# Patient Record
Sex: Female | Born: 2000 | Race: White | Hispanic: No | Marital: Single
Health system: Midwestern US, Community
[De-identification: ages and names within clinical notes are randomized; demographics above are authoritative.]

## PROBLEM LIST (undated history)

## (undated) ENCOUNTER — Inpatient Hospital Stay (HOSPITAL_COMMUNITY): Payer: Self-pay

## (undated) DIAGNOSIS — F909 Attention-deficit hyperactivity disorder, unspecified type: Secondary | ICD-10-CM

## (undated) DIAGNOSIS — O133 Gestational [pregnancy-induced] hypertension without significant proteinuria, third trimester: Secondary | ICD-10-CM

## (undated) DIAGNOSIS — D271 Benign neoplasm of left ovary: Secondary | ICD-10-CM

## (undated) DIAGNOSIS — G8918 Other acute postprocedural pain: Secondary | ICD-10-CM

## (undated) HISTORY — DX: Attention-deficit hyperactivity disorder, unspecified type: F90.9

## (undated) HISTORY — PX: TONSILLECTOMY: SUR1361

---
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
## 2020-08-05 ENCOUNTER — Inpatient Hospital Stay
Admit: 2020-08-05 | Discharge: 2020-08-05 | Disposition: A | Discharge status: Home / Self Care | Payer: MEDICAID | Attending: Emergency Medicine

## 2020-08-05 DIAGNOSIS — O26899 Other specified pregnancy related conditions, unspecified trimester: Secondary | ICD-10-CM

## 2020-08-05 LAB — COMPREHENSIVE METABOLIC PANEL
ALT: 14 U/L (ref 12–78)
AST: 30 U/L (ref 15–37)
Albumin/Globulin Ratio: 0.9 — ABNORMAL LOW (ref 1.1–2.2)
Albumin: 4 g/dL (ref 3.5–5.0)
Alkaline Phosphatase: 82 U/L (ref 45–117)
Anion Gap: 13 mmol/L (ref 5–15)
BUN: 3 MG/DL — ABNORMAL LOW (ref 6–20)
Bun/Cre Ratio: 5 — ABNORMAL LOW (ref 12–20)
CO2: 24 mmol/L (ref 21–32)
Calcium: 8.8 MG/DL (ref 8.5–10.1)
Chloride: 104 mmol/L (ref 97–108)
Creatinine: 0.55 MG/DL (ref 0.55–1.02)
EGFR IF NonAfrican American: >60 mL/min/{1.73_m2} (ref >60)
GFR African American: >60 mL/min/{1.73_m2} (ref >60)
Globulin: 4.3 g/dL — ABNORMAL HIGH (ref 2.0–4.0)
Glucose: 108 mg/dL — ABNORMAL HIGH (ref 65–100)
Potassium: 3.9 mmol/L (ref 3.5–5.1)
Sodium: 141 mmol/L (ref 136–145)
Total Bilirubin: 0.6 MG/DL (ref 0.2–1.0)
Total Protein: 8.3 g/dL — ABNORMAL HIGH (ref 6.4–8.2)

## 2020-08-05 LAB — CBC WITH AUTO DIFFERENTIAL
Basophils %: 0 % (ref 0–1)
Basophils Absolute: 0 10*3/uL (ref 0.0–0.1)
Eosinophils %: 0 % (ref 0–7)
Eosinophils Absolute: 0 10*3/uL (ref 0.0–0.4)
Granulocyte Absolute Count: 0 10*3/uL (ref 0.00–0.04)
Hematocrit: 42.9 % (ref 35.0–47.0)
Hemoglobin: 13.9 g/dL (ref 11.5–16.0)
Immature Granulocytes: 0 % (ref 0.0–0.5)
Lymphocytes %: 20 % (ref 12–49)
Lymphocytes Absolute: 1.9 10*3/uL (ref 0.8–3.5)
MCH: 28.8 PG (ref 26.0–34.0)
MCHC: 32.4 g/dL (ref 30.0–36.5)
MCV: 88.8 FL (ref 80.0–99.0)
MPV: 10.3 FL (ref 8.9–12.9)
Monocytes %: 5 % (ref 5–13)
Monocytes Absolute: 0.5 10*3/uL (ref 0.0–1.0)
NRBC Absolute: 0 10*3/uL (ref 0.00–0.01)
Neutrophils %: 75 % (ref 32–75)
Neutrophils Absolute: 6.9 10*3/uL (ref 1.8–8.0)
Nucleated RBCs: 0 PER 100 WBC
Platelets: 405 10*3/uL — ABNORMAL HIGH (ref 150–400)
RBC: 4.83 M/uL (ref 3.80–5.20)
RDW: 12.8 % (ref 11.5–14.5)
WBC: 9.4 10*3/uL (ref 3.6–11.0)

## 2020-08-05 LAB — HCG URINE, QL. - POC
HCG, Pregnancy, Urine, POC: NEGATIVE
Pregnancy test,urine (POC): NEGATIVE

## 2020-08-05 LAB — URINALYSIS WITH MICROSCOPIC
BACTERIA, URINE: NEGATIVE /hpf
Bilirubin, Urine: NEGATIVE
Blood, Urine: NEGATIVE
Glucose, Ur: NEGATIVE mg/dL
Ketones, Urine: NEGATIVE mg/dL
Nitrite, Urine: NEGATIVE
Protein, UA: NEGATIVE mg/dL
Specific Gravity, UA: <1.005 NA (ref 1.003–1.030)
Urobilinogen, UA, POCT: 0.2 EU/dL (ref 0.2–1.0)
pH, UA: 7 (ref 5.0–8.0)

## 2020-08-05 LAB — URINALYSIS W/MICROSCOPIC
Bacteria: NEGATIVE /hpf
Bilirubin: NEGATIVE
Blood: NEGATIVE
Glucose: NEGATIVE mg/dL
Ketone: NEGATIVE mg/dL
Nitrites: NEGATIVE
Protein: NEGATIVE mg/dL
Specific gravity: <1.005 (ref 1.003–1.030)
Urobilinogen: 0.2 EU/dL (ref 0.2–1.0)
pH (UA): 7 (ref 5.0–8.0)

## 2020-08-05 LAB — CBC WITH AUTOMATED DIFF
ABS. BASOPHILS: 0 10*3/uL (ref 0.0–0.1)
ABS. EOSINOPHILS: 0 10*3/uL (ref 0.0–0.4)
ABS. IMM. GRANS.: 0 10*3/uL (ref 0.00–0.04)
ABS. LYMPHOCYTES: 1.9 10*3/uL (ref 0.8–3.5)
ABS. MONOCYTES: 0.5 10*3/uL (ref 0.0–1.0)
ABS. NEUTROPHILS: 6.9 10*3/uL (ref 1.8–8.0)
ABSOLUTE NRBC: 0 10*3/uL (ref 0.00–0.01)
BASOPHILS: 0 % (ref 0–1)
EOSINOPHILS: 0 % (ref 0–7)
HCT: 42.9 % (ref 35.0–47.0)
HGB: 13.9 g/dL (ref 11.5–16.0)
IMMATURE GRANULOCYTES: 0 % (ref 0.0–0.5)
LYMPHOCYTES: 20 % (ref 12–49)
MCH: 28.8 PG (ref 26.0–34.0)
MCHC: 32.4 g/dL (ref 30.0–36.5)
MCV: 88.8 FL (ref 80.0–99.0)
MONOCYTES: 5 % (ref 5–13)
MPV: 10.3 FL (ref 8.9–12.9)
NEUTROPHILS: 75 % (ref 32–75)
NRBC: 0 PER 100 WBC
PLATELET: 405 10*3/uL — ABNORMAL HIGH (ref 150–400)
RBC: 4.83 M/uL (ref 3.80–5.20)
RDW: 12.8 % (ref 11.5–14.5)
WBC: 9.4 10*3/uL (ref 3.6–11.0)

## 2020-08-05 LAB — METABOLIC PANEL, COMPREHENSIVE
A-G Ratio: 0.9 — ABNORMAL LOW (ref 1.1–2.2)
ALT (SGPT): 14 U/L (ref 12–78)
AST (SGOT): 30 U/L (ref 15–37)
Albumin: 4 g/dL (ref 3.5–5.0)
Alk. phosphatase: 82 U/L (ref 45–117)
Anion gap: 13 mmol/L (ref 5–15)
BUN/Creatinine ratio: 5 — ABNORMAL LOW (ref 12–20)
BUN: 3 MG/DL — ABNORMAL LOW (ref 6–20)
Bilirubin, total: 0.6 MG/DL (ref 0.2–1.0)
CO2: 24 mmol/L (ref 21–32)
Calcium: 8.8 MG/DL (ref 8.5–10.1)
Chloride: 104 mmol/L (ref 97–108)
Creatinine: 0.55 MG/DL (ref 0.55–1.02)
GFR est AA: >60 mL/min/{1.73_m2} (ref >60)
GFR est non-AA: >60 mL/min/{1.73_m2} (ref >60)
Globulin: 4.3 g/dL — ABNORMAL HIGH (ref 2.0–4.0)
Glucose: 108 mg/dL — ABNORMAL HIGH (ref 65–100)
Potassium: 3.9 mmol/L (ref 3.5–5.1)
Protein, total: 8.3 g/dL — ABNORMAL HIGH (ref 6.4–8.2)
Sodium: 141 mmol/L (ref 136–145)

## 2020-08-05 LAB — URINE CULTURE HOLD SAMPLE

## 2020-08-05 MED ORDER — KETOROLAC TROMETHAMINE 30 MG/ML INJECTION
30 mg/mL (1 mL) | INTRAMUSCULAR | Status: AC
Start: 2020-08-05 — End: 2020-08-05
  Administered 2020-08-05: 09:00:00 via INTRAVENOUS

## 2020-08-05 MED ORDER — ONDANSETRON 4 MG TAB, RAPID DISSOLVE
4 mg | Freq: Three times a day (TID) | ORAL | Status: DC | PRN
Start: 2020-08-05 — End: 2020-08-05
  Administered 2020-08-05: 09:00:00 via ORAL

## 2020-08-05 MED FILL — KETOROLAC TROMETHAMINE 30 MG/ML INJECTION: 30 mg/mL (1 mL) | INTRAMUSCULAR | Qty: 1

## 2020-08-05 MED FILL — ONDANSETRON 4 MG TAB, RAPID DISSOLVE: 4 mg | ORAL | Qty: 2

## 2020-08-05 NOTE — ED Notes (Signed)
Triage note:intermittent cramp type mid lower abdominal pain x 2 hours denies pregnancy.

## 2020-08-05 NOTE — ED Notes (Signed)
 The patient left the Emergency Department ambulatory, alert and oriented and in no acute distress. The patient was encouraged to call or return to the ED for worsening issues or problems and was encouraged to schedule a follow up appointment for continuing care.      The patient verbalized understanding of discharge instructions and prescriptions, all questions were answered. The patient has no further concerns at this time.

## 2020-08-05 NOTE — ED Provider Notes (Signed)
HPI     20 year old female with a history of preeclampsia with her first pregnancy.  She to PTA presents the emergency department with 2 hours of abdominal cramping.  Patient states she had an IUD placed in September October.  This is the first time she has had any cramping pain since.  She has not taken anything for her  symptoms prior to coming.  She denies any fever.  She has some nausea but no vomiting.  She is not having any vaginal bleeding.  She states there is always a possibility she is pregnant.  She denies any change in bowel or urine.  She has been vaccinated for Covid and denies any Covid symptoms or exposure.  There is no back pain    No past medical history on file.    No past surgical history on file.      No family history on file.    Social History     Socioeconomic History   ??? Marital status: Not on file     Spouse name: Not on file   ??? Number of children: Not on file   ??? Years of education: Not on file   ??? Highest education level: Not on file   Occupational History   ??? Not on file   Tobacco Use   ??? Smoking status: Not on file   ??? Smokeless tobacco: Not on file   Substance and Sexual Activity   ??? Alcohol use: Not on file   ??? Drug use: Not on file   ??? Sexual activity: Not on file   Other Topics Concern   ??? Not on file   Social History Narrative   ??? Not on file     Social Determinants of Health     Financial Resource Strain:    ??? Difficulty of Paying Living Expenses: Not on file   Food Insecurity:    ??? Worried About Running Out of Food in the Last Year: Not on file   ??? Ran Out of Food in the Last Year: Not on file   Transportation Needs:    ??? Lack of Transportation (Medical): Not on file   ??? Lack of Transportation (Non-Medical): Not on file   Physical Activity:    ??? Days of Exercise per Week: Not on file   ??? Minutes of Exercise per Session: Not on file   Stress:    ??? Feeling of Stress : Not on file   Social Connections:    ??? Frequency of Communication with Friends and Family: Not on file   ???  Frequency of Social Gatherings with Friends and Family: Not on file   ??? Attends Religious Services: Not on file   ??? Active Member of Clubs or Organizations: Not on file   ??? Attends Banker Meetings: Not on file   ??? Marital Status: Not on file   Intimate Partner Violence:    ??? Fear of Current or Ex-Partner: Not on file   ??? Emotionally Abused: Not on file   ??? Physically Abused: Not on file   ??? Sexually Abused: Not on file   Housing Stability:    ??? Unable to Pay for Housing in the Last Year: Not on file   ??? Number of Places Lived in the Last Year: Not on file   ??? Unstable Housing in the Last Year: Not on file         ALLERGIES: Patient has no known allergies.    Review of  Systems   Constitutional: Negative for fever.   HENT: Negative for congestion.    Eyes: Negative for visual disturbance.   Respiratory: Negative for cough and shortness of breath.    Cardiovascular: Negative for chest pain.   Gastrointestinal: Positive for abdominal pain and nausea. Negative for constipation, diarrhea and vomiting.   Endocrine: Negative for polyuria.   Genitourinary: Positive for menstrual problem and pelvic pain. Negative for dysuria, flank pain, vaginal bleeding and vaginal discharge.   Musculoskeletal: Negative for gait problem.   Neurological: Negative for headaches.   Psychiatric/Behavioral: Negative for dysphoric mood.       There were no vitals filed for this visit.         Physical Exam  Constitutional:       General: She is not in acute distress.     Appearance: She is well-developed.   HENT:      Head: Normocephalic and atraumatic.      Mouth/Throat:      Pharynx: No oropharyngeal exudate.   Eyes:      General: No scleral icterus.        Right eye: No discharge.         Left eye: No discharge.      Pupils: Pupils are equal, round, and reactive to light.   Neck:      Vascular: No JVD.   Cardiovascular:      Rate and Rhythm: Normal rate and regular rhythm.      Heart sounds: Normal heart sounds. No murmur  heard.      Pulmonary:      Effort: Pulmonary effort is normal. No respiratory distress.      Breath sounds: Normal breath sounds. No stridor. No wheezing or rales.   Chest:      Chest wall: No tenderness.   Abdominal:      General: Bowel sounds are normal. There is no distension.      Palpations: Abdomen is soft. There is no mass.      Tenderness: There is abdominal tenderness in the suprapubic area. There is no guarding or rebound.   Musculoskeletal:         General: Normal range of motion.      Cervical back: Normal range of motion and neck supple.   Skin:     General: Skin is warm and dry.      Capillary Refill: Capillary refill takes less than 2 seconds.      Findings: No rash.   Neurological:      Mental Status: She is oriented to person, place, and time.   Psychiatric:         Behavior: Behavior normal.         Thought Content: Thought content normal.         Judgment: Judgment normal.          MDM       Procedures    CBC normal. preg neg. Cramps resolved with toradol. Patient wants to go home and see her Ob/GYN regarding getting IUD out.  Patient can follow up as planned. Return if worsening

## 2021-10-25 ENCOUNTER — Inpatient Hospital Stay
Admit: 2021-10-25 | Discharge: 2021-10-25 | Disposition: A | Discharge status: Home / Self Care | Payer: MEDICAID | Attending: Emergency Medicine

## 2021-10-25 ENCOUNTER — Emergency Department: Admit: 2021-10-25 | Payer: MEDICAID

## 2021-10-25 DIAGNOSIS — N938 Other specified abnormal uterine and vaginal bleeding: Secondary | ICD-10-CM

## 2021-10-25 DIAGNOSIS — N83299 Other ovarian cyst, unspecified side: Secondary | ICD-10-CM

## 2021-10-25 LAB — CBC WITH AUTO DIFFERENTIAL
Absolute Immature Granulocyte: 0 10*3/uL (ref 0.00–0.04)
Basophils %: 0 % (ref 0–1)
Basophils Absolute: 0 10*3/uL (ref 0.0–0.1)
Eosinophils %: 0 % (ref 0–7)
Eosinophils Absolute: 0 10*3/uL (ref 0.0–0.4)
Hematocrit: 34.5 % — ABNORMAL LOW (ref 35.0–47.0)
Hemoglobin: 11.5 g/dL (ref 11.5–16.0)
Immature Granulocytes: 1 % — ABNORMAL HIGH (ref 0–0.5)
Lymphocytes %: 17 % (ref 12–49)
Lymphocytes Absolute: 1.4 10*3/uL (ref 0.8–3.5)
MCH: 30.3 PG (ref 26.0–34.0)
MCHC: 33.3 g/dL (ref 30.0–36.5)
MCV: 90.8 FL (ref 80.0–99.0)
MPV: 9.2 FL (ref 8.9–12.9)
Monocytes %: 8 % (ref 5–13)
Monocytes Absolute: 0.6 10*3/uL (ref 0.0–1.0)
Neutrophils %: 74 % (ref 32–75)
Neutrophils Absolute: 6.1 10*3/uL (ref 1.8–8.0)
Nucleated RBCs: 0 PER 100 WBC
Platelets: 370 10*3/uL (ref 150–400)
RBC: 3.8 M/uL (ref 3.80–5.20)
RDW: 12.4 % (ref 11.5–14.5)
WBC: 8.2 10*3/uL (ref 3.6–11.0)
nRBC: 0 10*3/uL (ref 0.00–0.01)

## 2021-10-25 LAB — COMPREHENSIVE METABOLIC PANEL
ALT: 13 U/L (ref 12–78)
AST: 11 U/L — ABNORMAL LOW (ref 15–37)
Albumin/Globulin Ratio: 0.9 — ABNORMAL LOW (ref 1.1–2.2)
Albumin: 3.7 g/dL (ref 3.5–5.0)
Alk Phosphatase: 70 U/L (ref 45–117)
Anion Gap: 6 mmol/L (ref 5–15)
BUN: 5 mg/dL — ABNORMAL LOW (ref 6–20)
Bun/Cre Ratio: 8 — ABNORMAL LOW (ref 12–20)
CO2: 29 mmol/L (ref 21–32)
Calcium: 9.2 mg/dL (ref 8.5–10.1)
Chloride: 101 mmol/L (ref 97–108)
Creatinine: 0.59 mg/dL (ref 0.55–1.02)
Est, Glom Filt Rate: >60 mL/min/{1.73_m2} (ref >60)
Globulin: 3.9 g/dL (ref 2.0–4.0)
Glucose: 93 mg/dL (ref 65–100)
Potassium: 3.7 mmol/L (ref 3.5–5.1)
Sodium: 136 mmol/L (ref 136–145)
Total Bilirubin: 0.5 mg/dL (ref 0.2–1.0)
Total Protein: 7.6 g/dL (ref 6.4–8.2)

## 2021-10-25 LAB — URINALYSIS
Bilirubin Urine: NEGATIVE
Glucose, UA: NEGATIVE mg/dL
Ketones, Urine: NEGATIVE mg/dL
Leukocyte Esterase, Urine: NEGATIVE
Nitrite, Urine: NEGATIVE
Protein, UA: NEGATIVE mg/dL
Specific Gravity, UA: 1.02 (ref 1.003–1.030)
Urobilinogen, Urine: 0.2 EU/dL (ref 0.2–1.0)
pH, Urine: 6 (ref 5.0–8.0)

## 2021-10-25 LAB — PREGNANCY, URINE: HCG(Urine) Pregnancy Test: NEGATIVE

## 2021-10-25 LAB — URINALYSIS, MICRO: BACTERIA, URINE: NEGATIVE /hpf

## 2021-10-25 MED ORDER — MEDROXYPROGESTERONE ACETATE 2.5 MG PO TABS
2.5 MG | ORAL_TABLET | Freq: Three times a day (TID) | ORAL | 0 refills | Status: DC
Start: 2021-10-25 — End: 2021-12-05

## 2021-10-25 MED ORDER — ACETAMINOPHEN 500 MG PO TABS
500 MG | ORAL_TABLET | Freq: Four times a day (QID) | ORAL | 0 refills | Status: AC | PRN
Start: 2021-10-25 — End: 2022-01-01

## 2021-10-25 MED ORDER — IBUPROFEN 400 MG PO TABS
400 MG | ORAL_TABLET | Freq: Four times a day (QID) | ORAL | 0 refills | Status: DC | PRN
Start: 2021-10-25 — End: 2022-01-01

## 2021-10-25 NOTE — ED Triage Notes (Signed)
Pt reports heavy vaginal bleeding with clots x 4 days. Pt states she went to [facility] ER and tested negative for pregnancy and her quant was negative. Reports she had a pelvic exam and they told her that her cervix was closed. Pt is not on any contraceptive.

## 2021-10-25 NOTE — ED Provider Notes (Signed)
[facility] EMERGENCY DEPT  EMERGENCY DEPARTMENT ENCOUNTER       Pt Name: Emily Ware  MRN: [id number]  Birthdate 21-May-2001  Date of evaluation: 10/25/2021  Provider: Aggie Cosier, MD   PCP: None None  Note Started: 7:42 PM 10/25/21     CHIEF COMPLAINT       Chief Complaint   Patient presents with    Vaginal Bleeding        HISTORY OF PRESENT ILLNESS: 1 or more elements      History From: Patient  None     Emily Ware is a 21 y.o. female who presents with large clots, heavy bleeding one reportedly "as long as my arm". States bleeding through tampons every hour. Bleeding for 4-5 days is is growing more concerned.     Seen at [facility] 2 days ago (unable to see that chart on Care everywhere). States was tested for stds and taken vaginal exam and told symptoms were normal.     States was having lower abdominal cramping pains     Previously had IUD that was removed one year ago and also was on Depo >1 years. No birth control for the past year.     No OB/Gyn.      Nursing Notes were all reviewed and agreed with or any disagreements were addressed in the HPI.     REVIEW OF SYSTEMS      Review of Systems   Constitutional:  Positive for activity change.   HENT: Negative.     Respiratory: Negative.     Cardiovascular: Negative.    Gastrointestinal:  Positive for abdominal pain. Negative for nausea and vomiting.   Genitourinary:  Positive for vaginal bleeding. Negative for dyspareunia and dysuria.   Musculoskeletal: Negative.    Skin: Negative.       Positives and Pertinent negatives as per HPI.    PAST HISTORY     Past Medical History:  History reviewed. No pertinent past medical history.      Past Surgical History:  History reviewed. No pertinent surgical history.    Family History:  History reviewed. No pertinent family history.    Social History:  Social History     Tobacco Use    Smoking status: Never    Smokeless tobacco: Never   Substance Use Topics    Drug use: Never       Allergies:  No Known Allergies    CURRENT  MEDICATIONS      Discharge Medication List as of 10/25/2021 10:13 AM            PHYSICAL EXAM      ED Triage Vitals [10/25/21 0749]   Enc Vitals Group      BP (!) 155/89      Pulse 93      Respirations 19      Temp 97.8 F (36.6 C)      Temp src       SpO2 99 %      Weight - Scale 107 lb (48.5 kg)      Height 5' (1.524 m)      Head Circumference       Peak Flow       Pain Score       Pain Loc       Pain Edu?       Excl. in GC?               Physical Exam  Vitals and nursing note reviewed.  Constitutional:       Appearance: Normal appearance.      Comments: Anxious     HENT:      Head: Normocephalic.      Right Ear: External ear normal.      Left Ear: External ear normal.      Nose: Nose normal.      Mouth/Throat:      Mouth: Mucous membranes are moist.   Cardiovascular:      Rate and Rhythm: Normal rate.      Pulses: Normal pulses.      Heart sounds: Normal heart sounds.   Pulmonary:      Effort: Pulmonary effort is normal.   Abdominal:      General: Abdomen is flat.      Palpations: Abdomen is soft.      Tenderness: There is abdominal tenderness (discomfort to palpation rlq).   Musculoskeletal:      Cervical back: Normal range of motion.   Skin:     General: Skin is warm.      Capillary Refill: Capillary refill takes less than 2 seconds.   Neurological:      General: No focal deficit present.      Mental Status: She is alert.   Psychiatric:         Mood and Affect: Mood normal.           DIAGNOSTIC RESULTS   LABS:             Contains abnormal data CBC with Auto Differential (Final result)   Component (Lab Inquiry)  Collection Time Result Time WBC RBC HGB HCT MCV   10/25/21 08:17:00 10/25/21 08:29:31 8.2 3.80 11.5 34.5 Low  90.8       Collection Time Result Time MCH MCHC RDW PLT MPV   10/25/21 08:17:00 10/25/21 08:29:31 30.3 33.3 12.4 370 9.2       Collection Time Result Time NRBC NRBC Seg Neutrophils LYMPHO Monocytes   10/25/21 08:17:00 10/25/21 08:29:31 0.0 0.00 74 17 8       Collection Time Result Time EOS BASO  Immature Granulocytes SEGS ABS Absolute Lymph #   10/25/21 08:17:00 10/25/21 08:29:31 0 0 1 High  6.1 1.4       Collection Time Result Time Absolute Mono # Absolute Eos # BASOS ABS absimmgran DIFF TYPE   10/25/21 08:17:00 10/25/21 08:29:31 0.6 0.0 0.0 0.0 AUTOMATED         Final result                           Contains abnormal data Comprehensive Metabolic Panel (Final result)   Component (Lab Inquiry)  Collection Time Result Time NA K CL CO2 ANION GAP   10/25/21 08:17:00 10/25/21 08:46:58 136 3.7 101 29 6       Collection Time Result Time GLUCOSE BUN Creatinine Bun/Cre Ratio EGFR   10/25/21 08:17:00 10/25/21 08:46:58 93 5 Low  0.59 8 Low  >60       Pediatric calculato...       Collection Time Result Time CALCIUM BILI TOTAL AST ALT Alk Phosphatase   10/25/21 08:17:00 10/25/21 08:46:58 9.2 0.5 11 Low  13 70       Collection Time Result Time PROTEIN Albumin Globulin Albumin/Globulin Ratio   10/25/21 08:17:00 10/25/21 08:46:58 7.6 3.7 3.9 0.9 Low          Final result  Pregnancy, Urine (Final result)   Component (Lab Inquiry)  Collection Time Result Time HCG(Urine) Pregnancy Test   10/25/21 07:49:00 10/25/21 08:05:03 Negative         Final result                           Contains abnormal data Urinalysis (Final result)   Component (Lab Inquiry)  Collection Time Result Time COLOR U APPEARANCE SPEC GRAV pH, Urine Protein, UA   10/25/21 07:49:00 10/25/21 08:05:42 Yellow/Straw   Color Reference Range:... Clear 1.020 6.0 Negative       Collection Time Result Time GLUCOSE U KETONES U BILI UR BLOOD U Urobilinogen, Urine   10/25/21 07:49:00 10/25/21 08:05:42 Negative Negative Negative Large Abnormal  0.2       Collection Time Result Time NITRITE LEUKOCYTES, UR   10/25/21 07:49:00 10/25/21 08:05:42 Negative Negative         Final result                          Urinalysis, Micro (Final result)   Component (Lab Inquiry)  Collection Time Result Time WBC UA RBC  UA BACTERIA, URINE   10/25/21 07:49:00  10/25/21 08:19:37 0-4 0-5   0-5 Negative         Final result                      Imaging Results        US PELVIS COMPLETE (Final result)  Result time 10/25/21 09:42:06  Final result by Milda Smart Disler, MD (10/25/21 09:42:06)                Impression:    Complex cyst of right ovary measuring 5.6 x 5.3 x 3.8 cm.            Narrative:    INDICATION: pelvic pain, dysmenorrhea,     COMPARISON: None     EXAM: Real-time transabdominal ultrasound of the pelvis appear     FINDINGS: There is suboptimal assessment of the pelvis due to under distention   of the urinary bladder. The uterus measures 9.3 x 3.2 x 3.2 cm in size with   endometrial stripe thickness is estimated at 0.2 cm. No uterine mass is   suggested. No cervical enlargement is shown. Doppler flow is demonstrated in the   ovaries bilaterally with the right ovary measuring 2.3 x 2.0 x 2.5 cm in size.   The left ovary measures 3.1 x 4.3 x 5.4 cm with demonstration of complex cyst   measuring 5.6 x 5.3 x 3.8 cm. No free pelvic fluid is shown.                     RADIOLOGY:  Non-plain film images such as CT, Ultrasound and MRI are read by the radiologist. Plain radiographic images are visualized and preliminarily interpreted by the ED Provider with the below findings:     As aboe\ve     Interpretation per the Radiologist below, if available at the time of this note:     US PELVIS COMPLETE   Final Result   Complex cyst of right ovary measuring 5.6 x 5.3 x 3.8 cm.              PROCEDURES   Unless otherwise noted below, none  Procedures       CRITICAL CARE TIME   Not met  SCREENINGS   NIH Stroke Score       Heart Score       Curb-65          EMERGENCY DEPARTMENT COURSE and DIFFERENTIAL DIAGNOSIS/MDM   Vitals:    Vitals:    10/25/21 0749 10/25/21 0756 10/25/21 0914   BP: (!) 155/89 (!) 155/89 126/81   Pulse: 93 93 81   Resp: 19 19 14    Temp: 97.8 F (36.6 C) 97.8 F (36.6 C)    SpO2: 99% 99% 100%   Weight: 48.5 kg (107 lb)     Height: 1.524 m (5')               Patient was given the following medications:  Medications - No data to display    CONSULTS: (Who and What was discussed)  None      Chronic Conditions: none    Social Determinants affecting Dx or Tx: None    Records Reviewed (source and summary of external notes): Nursing Notes    CC/HPI Summary, DDx, ED Course, and Reassessment: clinically less likely to be torsion. Consider fibroids, cyst, endometriosis or normal varian menses in additiona ot recent pregnancy loss or ectopic.     ED Course as of 10/26/21 1942   Wed Oct 25, 2021   9562 Pt with heavy vaginal bleeding, recent visit to [facility] which I am unable to pull on on Care Everywhere. She is able to pull up recent labs which are unremarkable. She is upset due to perception that "nothing was done". We discussed at length all that was evaluated within  the limitations of EM evaluations. She declines repeat pelvic exam. Will recheck labs and obtain US but advise she see OB for evaluation of dysmenorrhea.  [MC]   [phone] Discussed US findings with patient. Consulting with GYN given size of cyst. Pending callback   [MC]   1002 Spoke with GYN on call Dr. Thedore Mins at [facility]. Recommends outpatient follow up for cyst. Pt to call office asap to make op appointment for options. Would like to start on OCP to control bleeding.  [MC]      ED Course User Index  [MC] Burnard Hawthorne, MD       Disposition Considerations (Tests not done, Shared Decision Making, Pt Expectation of Test or Tx.):       FINAL IMPRESSION     1. Complex ovarian cyst    2. DUB (dysfunctional uterine bleeding)    3. Vaginal bleeding    4. Abnormal vaginal bleeding          DISPOSITION/PLAN   DISPOSITION Decision To Discharge 10/25/2021 10:03:38 AM      Discharge Note: The patient is stable for discharge home. The signs, symptoms, diagnosis, and discharge instructions have been discussed, understanding conveyed, and agreed upon. The patient is to follow up as recommended or return to ER should their  symptoms worsen.      PATIENT REFERRED TO:  Holley Bouche, MD  [address]  [address]  [place] [address]  [phone]    Call in 1 day  for followup         DISCHARGE MEDICATIONS:     Medication List        START taking these medications      acetaminophen 500 MG tablet  Commonly known as: TYLENOL  Take 1 tablet by mouth 4 times daily as needed for Pain     ibuprofen 400 MG tablet  Commonly  known as: IBU  Take 1 tablet by mouth every 6 hours as needed for Pain     medroxyPROGESTERone 2.5 MG tablet  Commonly known as: Provera  Take 4 tablets by mouth every 8 (eight) hours for 7 days               Where to Get Your Medications        These medications were sent to [id number] [place] - [address] - P [phone] Carmon Ginsberg [phone]  [address] [place], [place] [address]      Phone: [phone]   acetaminophen 500 MG tablet  ibuprofen 400 MG tablet  medroxyPROGESTERone 2.5 MG tablet           DISCONTINUED MEDICATIONS:  Discharge Medication List as of 10/25/2021 10:13 AM          I am the Primary Clinician of Record.   Aggie Cosier, MD (electronically signed)      (Please note that parts of this dictation were completed with voice recognition software. Quite often unanticipated grammatical, syntax, homophones, and other interpretive errors are inadvertently transcribed by the computer software. Please disregards these errors. Please excuse any errors that have escaped final proofreading.)        Burnard Hawthorne, MD  10/26/21 1942

## 2021-10-25 NOTE — ED Notes (Signed)
Labs drawn      Darnelle Catalan, RN  10/25/21 [phone]

## 2021-10-25 NOTE — ED Notes (Signed)
UA sent     Darnelle Catalan, RN  10/25/21 [phone]

## 2021-11-06 ENCOUNTER — Encounter: Payer: MEDICAID | Attending: Specialist

## 2021-12-11 ENCOUNTER — Ambulatory Visit: Admit: 2021-12-11 | Discharge: 2021-12-11 | Payer: MEDICAID

## 2021-12-11 ENCOUNTER — Ambulatory Visit: Admit: 2021-12-11 | Discharge: 2021-12-11 | Payer: MEDICAID | Attending: Obstetrics & Gynecology

## 2021-12-11 DIAGNOSIS — N83209 Unspecified ovarian cyst, unspecified side: Secondary | ICD-10-CM

## 2021-12-11 DIAGNOSIS — N83202 Unspecified ovarian cyst, left side: Secondary | ICD-10-CM

## 2021-12-11 NOTE — Progress Notes (Signed)
HISTORY OF PRESENT ILLNESS  Emily Ware is a 21 y.o. female who presents today for the following:  Chief Complaint   Patient presents with    Ovarian Cyst     Pt. Was seen in May for a left ovarian cyst   Patient is a 21 year old G2 P2 female who presents today for follow-up from the ER for a large left ovarian cyst.  Patient reports continued pain in the left lower quadrant.  Ultrasound today reveals a persistent left ovarian cyst which is enlarged from previous exam.  [id number]    No Known Allergies    Current Outpatient Medications   Medication Sig Dispense Refill    acetaminophen (TYLENOL) 500 MG tablet Take 1 tablet by mouth 4 times daily as needed for Pain 120 tablet 0    ibuprofen (IBU) 400 MG tablet Take 1 tablet by mouth every 6 hours as needed for Pain 120 tablet 0     No current facility-administered medications for this visit.        Past Medical History:   Diagnosis Date    Anemia     Pre-eclampsia 2017    During pregnancy    Preterm delivery 2018    1 child my son currently 68 years old       Past Surgical History:   Procedure Laterality Date    CESAREAN SECTION  2018 & 2020       Family History   Problem Relation Age of Onset    Diabetes Mother     Diabetes Maternal Grandfather     Diabetes Maternal Grandmother        Social History     Socioeconomic History    Marital status: Single     Spouse name: Not on file    Number of children: Not on file    Years of education: Not on file    Highest education level: Not on file   Occupational History    Not on file   Tobacco Use    Smoking status: Never    Smokeless tobacco: Never   Substance and Sexual Activity    Alcohol use: Never    Drug use: Never    Sexual activity: Yes     Partners: Male     Birth control/protection: I.U.D.   Other Topics Concern    Not on file   Social History Narrative    Not on file     Social Determinants of Health     Financial Resource Strain: Not on file   Food Insecurity: Not on file   Transportation Needs: Not on file    Physical Activity: Not on file   Stress: Not on file   Social Connections: Not on file   Intimate Partner Violence: Not on file   Housing Stability: Not on file             PHYSICAL EXAM  BP 105/69 (Site: Left Upper Arm, Position: Sitting, Cuff Size: Small Adult)   Wt 106 lb (48.1 kg)   LMP 11/23/2021 (Approximate)   BMI 20.70 kg/m      Patient is a well-developed well-nourished female no apparent distress  She is alert and oriented x3  Head is normocephalic atraumatic pupils equal round react light accommodation  Neck is supple without adenopathy or thyromegaly  Heart is with regular rate and rhythm without murmurs rubs or gallops  Lungs are clear to auscultation and percussion bilaterally  Breasts are without masses bilaterally  Abdomen is soft nontender  nondistended bowel sounds are present and active  Extremities are without clubbing cyanosis or edema  Pulses are full and symmetric bilaterally  Pelvic  External genitalia within normal limits  Urethra is midline there are no apparent urethral lesions the bladder is within normal limits  Vagina is with normal rugae there is minimal discharge present in the vaginal vault  Cervix is parous, there are no apparent cervical lesions, there is no cervical motion tenderness  Uterus is normal size and contours  Adnexa with mass in left lower quadrant    ASSESSMENT and PLAN  Persistent left ovarian cyst  Plan: Scheduled for diagnostic laparoscopy and left ovarian cystectomy 01/01/2022.  @THISVISITONLYTEXTRESULTS @    Orders Placed This Encounter   Procedures    NON OB TRANSVAGINAL           Standing Status:   Future     Number of Occurrences:   1     Standing Expiration Date:   12/12/2022     Order Specific Question:   Reason for exam:     Answer:   Left Ovarian Cyst

## 2021-12-11 NOTE — Progress Notes (Signed)
Emily Ware is a 21 y.o. female who presents today for the following:  Chief Complaint   Patient presents with    Ovarian Cyst     Pt. Was seen in May for a left ovarian cyst          HPI  Is a 21 year old G2, P2 female who presents today for follow-up of a left ovarian cyst which was diagnosed through the ER.  Patient reports continued left-sided abdominal pain which has not improved over the 6+ weeks since her original ultrasound.    OB History       Gravida   2    Para   2    Term        Preterm   2    AB        Living   2         SAB        IAB        Ectopic        Molar        Multiple        Live Births   2                     @VS1 @   OBGyn Exam   Patient is well-developed well-nourished female no apparent distress  She is alert and oriented x3  Ultrasound today reveals 6-1/2 cm simple ovarian cyst which is actually increased in size from previous determination.  @THISVISITONLYTEXTRESULTS @       Assessment:  Persistent large left ovarian cyst  Plan: We will schedule patient for diagnostic laparoscopy and excision of left ovarian cyst    Orders Placed This Encounter   Procedures    NON OB TRANSVAGINAL           Standing Status:   Future     Number of Occurrences:   1     Standing Expiration Date:   12/12/2022     Order Specific Question:   Reason for exam:     Answer:   Left Ovarian Cyst

## 2021-12-11 NOTE — Progress Notes (Unsigned)
GYN -TRANSVAGINAL    Uterus:  Anteverted. 4.46 x 4.20 x 4.85cm    Fibroids: none visualized    Cervix: 3.11 cm    Endometrium:  .99cm     ROV:     3.71 x 2.26 x 2.77cm     LOV:  Enlarged. 6.20 x 5.98 x 6.48 cm. Irreg. Shaped anechoic cyst 5.25 x 4.87 x 5.08cm    No FF seen in CDS.

## 2021-12-26 ENCOUNTER — Inpatient Hospital Stay: Admit: 2021-12-26 | Payer: MEDICAID

## 2021-12-26 DIAGNOSIS — Z01812 Encounter for preprocedural laboratory examination: Secondary | ICD-10-CM

## 2021-12-26 LAB — CBC
Hematocrit: 41.1 % (ref 35.0–47.0)
Hemoglobin: 13.4 g/dL (ref 11.5–16.0)
MCH: 29.3 PG (ref 26.0–34.0)
MCHC: 32.6 g/dL (ref 30.0–36.5)
MCV: 89.9 FL (ref 80.0–99.0)
MPV: 10.9 FL (ref 8.9–12.9)
Nucleated RBCs: 0 PER 100 WBC
Platelets: 322 10*3/uL (ref 150–400)
RBC: 4.57 M/uL (ref 3.80–5.20)
RDW: 12.7 % (ref 11.5–14.5)
WBC: 4.9 10*3/uL (ref 3.6–11.0)
nRBC: 0 10*3/uL (ref 0.00–0.01)

## 2021-12-30 MED FILL — LACTATED RINGERS IV SOLN: INTRAVENOUS | Qty: 1000

## 2022-01-01 ENCOUNTER — Inpatient Hospital Stay: Payer: Medicaid (Managed Care) | Attending: Obstetrics & Gynecology

## 2022-01-01 LAB — POC PREGNANCY UR-QUAL: Preg Test, Ur: NEGATIVE

## 2022-01-01 MED ORDER — OXYCODONE HCL 5 MG PO TABS
5 MG | ORAL | Status: AC | PRN
Start: 2022-01-01 — End: 2022-01-01

## 2022-01-01 MED ORDER — OXYCODONE HCL 5 MG PO TABS
5 MG | ORAL | Status: AC | PRN
Start: 2022-01-01 — End: 2022-01-01
  Administered 2022-01-01: 16:00:00 10 mg via ORAL

## 2022-01-01 MED ORDER — HYDRALAZINE HCL 20 MG/ML IJ SOLN
20 MG/ML | INTRAMUSCULAR | Status: DC | PRN
Start: 2022-01-01 — End: 2022-01-01

## 2022-01-01 MED ORDER — FENTANYL CITRATE PF 50 MCG/ML IJ SOSY
50 MCG/ML | INTRAMUSCULAR | Status: DC | PRN
Start: 2022-01-01 — End: 2022-01-01
  Administered 2022-01-01: 13:00:00 50 via INTRAVENOUS

## 2022-01-01 MED ORDER — RHO D IMMUNE GLOBULIN 1500 UNIT/2ML IJ SOSY
1500 UNIT/2ML | Freq: Once | INTRAMUSCULAR | Status: DC | PRN
Start: 2022-01-01 — End: 2022-01-01

## 2022-01-01 MED ORDER — ONDANSETRON 4 MG PO TBDP
4 MG | Freq: Three times a day (TID) | ORAL | Status: DC | PRN
Start: 2022-01-01 — End: 2022-01-01

## 2022-01-01 MED ORDER — DIPHENHYDRAMINE HCL 50 MG/ML IJ SOLN
50 MG/ML | Freq: Once | INTRAMUSCULAR | Status: DC | PRN
Start: 2022-01-01 — End: 2022-01-01

## 2022-01-01 MED ORDER — METOCLOPRAMIDE HCL 5 MG/ML IJ SOLN
5 MG/ML | Freq: Once | INTRAMUSCULAR | Status: DC | PRN
Start: 2022-01-01 — End: 2022-01-01

## 2022-01-01 MED ORDER — OXYCODONE HCL 5 MG PO TABS
5 MG | ORAL | Status: DC | PRN
Start: 2022-01-01 — End: 2022-01-01

## 2022-01-01 MED ORDER — ROCURONIUM BROMIDE 50 MG/5ML IV SOLN
50 MG/5ML | INTRAVENOUS | Status: DC | PRN
Start: 2022-01-01 — End: 2022-01-01
  Administered 2022-01-01: 13:00:00 30 via INTRAVENOUS

## 2022-01-01 MED ORDER — FENTANYL CITRATE PF 50 MCG/ML IJ SOSY
50 MCG/ML | INTRAMUSCULAR | Status: DC | PRN
Start: 2022-01-01 — End: 2022-01-01

## 2022-01-01 MED ORDER — ESMOLOL HCL 100 MG/10ML IV SOLN
100 MG/10ML | INTRAVENOUS | Status: DC | PRN
Start: 2022-01-01 — End: 2022-01-01
  Administered 2022-01-01: 14:00:00 15 via INTRAVENOUS

## 2022-01-01 MED ORDER — LACTATED RINGERS IV SOLN
INTRAVENOUS | Status: DC
Start: 2022-01-01 — End: 2022-01-01

## 2022-01-01 MED ORDER — ONDANSETRON HCL 4 MG/2ML IJ SOLN
4 MG/2ML | Freq: Four times a day (QID) | INTRAMUSCULAR | Status: DC | PRN
Start: 2022-01-01 — End: 2022-01-01

## 2022-01-01 MED ORDER — NORMAL SALINE FLUSH 0.9 % IV SOLN
0.9 % | Freq: Two times a day (BID) | INTRAVENOUS | Status: DC
Start: 2022-01-01 — End: 2022-01-01

## 2022-01-01 MED ORDER — SODIUM CHLORIDE 0.9 % IV SOLN
0.9 % | INTRAVENOUS | Status: DC | PRN
Start: 2022-01-01 — End: 2022-01-01

## 2022-01-01 MED ORDER — LACTATED RINGERS IV SOLN
INTRAVENOUS | Status: DC
Start: 2022-01-01 — End: 2022-01-01
  Administered 2022-01-01: 12:00:00 via INTRAVENOUS

## 2022-01-01 MED ORDER — LABETALOL HCL 5 MG/ML IV SOLN
5 MG/ML | INTRAVENOUS | Status: DC | PRN
Start: 2022-01-01 — End: 2022-01-01

## 2022-01-01 MED ORDER — LACTATED RINGERS IV SOLN
Freq: Once | INTRAVENOUS | Status: DC
Start: 2022-01-01 — End: 2022-01-01

## 2022-01-01 MED ORDER — CEFAZOLIN SODIUM 1 G IJ SOLR
1 g | Freq: Once | INTRAMUSCULAR | Status: AC
Start: 2022-01-01 — End: 2022-01-01
  Administered 2022-01-01: 13:00:00 2000 mg via INTRAVENOUS

## 2022-01-01 MED ORDER — LIDOCAINE HCL 2 % IJ SOLN (MIXTURES ONLY)
2 % | INTRAMUSCULAR | Status: DC | PRN
Start: 2022-01-01 — End: 2022-01-01
  Administered 2022-01-01: 13:00:00 60 via INTRAVENOUS

## 2022-01-01 MED ORDER — LORAZEPAM 2 MG/ML IJ SOLN
2 MG/ML | Freq: Once | INTRAMUSCULAR | Status: DC | PRN
Start: 2022-01-01 — End: 2022-01-01

## 2022-01-01 MED ORDER — SUGAMMADEX SODIUM 200 MG/2ML IV SOLN
200 MG/2ML | INTRAVENOUS | Status: DC | PRN
Start: 2022-01-01 — End: 2022-01-01
  Administered 2022-01-01: 15:00:00 200 via INTRAVENOUS

## 2022-01-01 MED ORDER — DEXAMETHASONE 4 MG/ML IJ SOLN (MIXTURES ONLY)
4 MG/ML | INTRAMUSCULAR | Status: DC | PRN
Start: 2022-01-01 — End: 2022-01-01
  Administered 2022-01-01: 13:00:00 4 via INTRAVENOUS

## 2022-01-01 MED ORDER — IPRATROPIUM-ALBUTEROL 0.5-2.5 (3) MG/3ML IN SOLN
Freq: Once | RESPIRATORY_TRACT | Status: DC | PRN
Start: 2022-01-01 — End: 2022-01-01

## 2022-01-01 MED ORDER — BUPIVACAINE HCL (PF) 0.25 % IJ SOLN
0.25 % | INTRAMUSCULAR | Status: DC | PRN
Start: 2022-01-01 — End: 2022-01-01
  Administered 2022-01-01: 15:00:00 10 via INTRADERMAL

## 2022-01-01 MED ORDER — MEPERIDINE HCL 25 MG/ML IJ SOLN
25 MG/ML | INTRAMUSCULAR | Status: DC | PRN
Start: 2022-01-01 — End: 2022-01-01

## 2022-01-01 MED ORDER — MIDAZOLAM HCL 2 MG/2ML IJ SOLN
2 MG/ML | INTRAMUSCULAR | Status: DC | PRN
Start: 2022-01-01 — End: 2022-01-01
  Administered 2022-01-01: 13:00:00 2 via INTRAVENOUS

## 2022-01-01 MED ORDER — STERILE WATER FOR INJECTION (MIXTURES ONLY)
1 g | Freq: Once | INTRAMUSCULAR | Status: DC
Start: 2022-01-01 — End: 2022-01-01

## 2022-01-01 MED ORDER — NORMAL SALINE FLUSH 0.9 % IV SOLN
0.9 % | INTRAVENOUS | Status: DC | PRN
Start: 2022-01-01 — End: 2022-01-01

## 2022-01-01 MED ORDER — IBUPROFEN 400 MG PO TABS
400 MG | Freq: Three times a day (TID) | ORAL | Status: DC | PRN
Start: 2022-01-01 — End: 2022-01-01

## 2022-01-01 MED ORDER — IBUPROFEN 800 MG PO TABS
800 MG | ORAL_TABLET | Freq: Four times a day (QID) | ORAL | 0 refills | Status: AC | PRN
Start: 2022-01-01 — End: 2022-01-03

## 2022-01-01 MED ORDER — OXYCODONE HCL 5 MG PO TABS
5 MG | ORAL_TABLET | Freq: Four times a day (QID) | ORAL | 0 refills | Status: DC | PRN
Start: 2022-01-01 — End: 2022-01-03

## 2022-01-01 MED ORDER — KETOROLAC TROMETHAMINE 30 MG/ML IJ SOLN
30 MG/ML | INTRAMUSCULAR | Status: DC | PRN
Start: 2022-01-01 — End: 2022-01-01
  Administered 2022-01-01: 15:00:00 30 via INTRAVENOUS

## 2022-01-01 MED ORDER — HYDROMORPHONE HCL PF 1 MG/ML IJ SOLN
1 MG/ML | INTRAMUSCULAR | Status: DC | PRN
Start: 2022-01-01 — End: 2022-01-01
  Administered 2022-01-01 (×2): 0.5 mg via INTRAVENOUS

## 2022-01-01 MED ORDER — ONDANSETRON HCL 4 MG/2ML IJ SOLN
4 MG/2ML | INTRAMUSCULAR | Status: DC | PRN
Start: 2022-01-01 — End: 2022-01-01
  Administered 2022-01-01: 13:00:00 4 via INTRAVENOUS

## 2022-01-01 MED ORDER — ONDANSETRON HCL 4 MG/2ML IJ SOLN
4 MG/2ML | Freq: Once | INTRAMUSCULAR | Status: DC | PRN
Start: 2022-01-01 — End: 2022-01-01

## 2022-01-01 MED ORDER — PROPOFOL 200 MG/20ML IV EMUL
200 MG/20ML | INTRAVENOUS | Status: DC | PRN
Start: 2022-01-01 — End: 2022-01-01
  Administered 2022-01-01: 13:00:00 150 via INTRAVENOUS

## 2022-01-01 MED FILL — SODIUM CHLORIDE FLUSH 0.9 % IV SOLN: 0.9 % | INTRAVENOUS | Qty: 40

## 2022-01-01 MED FILL — RHOPHYLAC 1500 UNIT/2ML IJ SOSY: 1500 UNIT/2ML | INTRAMUSCULAR | Qty: 300

## 2022-01-01 MED FILL — LACTATED RINGERS IV SOLN: INTRAVENOUS | Qty: 1000

## 2022-01-01 NOTE — Anesthesia Pre-Procedure Evaluation (Addendum)
Department of Anesthesiology  Preprocedure Note       Name:  Emily Ware   Age:  21 y.o.  DOB:  03/21/2001                                          MRN:  [id number]         Date:  01/01/2022      Surgeon: Moishe Spice):  Gracelyn Nurse Oneita Jolly., MD    Procedure: Procedure(s):  DIAGNOSTIC LAPAROSCOPY, LEFT OVARIAN CYSTECTOMY    Medications prior to admission:   Prior to Admission medications    Medication Sig Start Date End Date Taking? Authorizing Provider   acetaminophen (TYLENOL) 500 MG tablet Take 1 tablet by mouth 4 times daily as needed for Pain  Patient not taking: Reported on 01/01/2022 10/25/21   Burnard Hawthorne, MD   ibuprofen (IBU) 400 MG tablet Take 1 tablet by mouth every 6 hours as needed for Pain 10/25/21   Burnard Hawthorne, MD       Current medications:    Current Facility-Administered Medications   Medication Dose Route Frequency Provider Last Rate Last Admin   . lactated ringers IV soln infusion   IntraVENous Continuous Ellen Henri., MD 20 mL/hr at 01/01/22 0854 NoRateChange at 01/01/22 0854   . ceFAZolin (ANCEF) 2,000 mg in sterile water 20 mL IV syringe  2,000 mg IntraVENous Once Ellen Henri., MD           Allergies:  No Known Allergies    Problem List:    Patient Active Problem List   Diagnosis Code   . Left ovarian cyst N83.202       Past Medical History:        Diagnosis Date   . Anemia    . H/O left flank pain     pt states with menstrual cycles x 3 months   . Ovarian cyst     left   . Pre-eclampsia 2017    During pregnancy   . Preterm delivery 2018    1 child my son currently 41 years old   . Sleep apnea     pt states diagnosed at 8 yrs ago ,pt states no cpap needed       Past Surgical History:        Procedure Laterality Date   . CESAREAN SECTION  2018 & 2020   . TONSILLECTOMY     . WISDOM TOOTH EXTRACTION         Social History:    Social History     Tobacco Use   . Smoking status: Never   . Smokeless tobacco: Never   Substance Use Topics   . Alcohol  use: Never                                Counseling given: Not Answered      Vital Signs (Current):   Vitals:    01/01/22 0752   BP: 115/77   Pulse: 65   Resp: 16   Temp: 97.7 F (36.5 C)   TempSrc: Oral   SpO2: 99%  BP Readings from Last 3 Encounters:   01/01/22 115/77   12/26/21 118/83   12/11/21 105/69       NPO Status: Time of last liquid consumption: 2230                        Time of last solid consumption: 2230                        Date of last liquid consumption: 12/31/21                        Date of last solid food consumption: 12/31/21    BMI:   Wt Readings from Last 3 Encounters:   12/26/21 47.6 kg (104 lb 15 oz)   12/11/21 48.1 kg (106 lb)   10/25/21 48.5 kg (107 lb)     There is no height or weight on file to calculate BMI.    CBC:   Lab Results   Component Value Date/Time    WBC 4.9 12/26/2021 09:25 AM    RBC 4.57 12/26/2021 09:25 AM    HGB 13.4 12/26/2021 09:25 AM    HCT 41.1 12/26/2021 09:25 AM    MCV 89.9 12/26/2021 09:25 AM    RDW 12.7 12/26/2021 09:25 AM    PLT 322 12/26/2021 09:25 AM       CMP:   Lab Results   Component Value Date/Time    NA 136 10/25/2021 08:17 AM    K 3.7 10/25/2021 08:17 AM    CL 101 10/25/2021 08:17 AM    CO2 29 10/25/2021 08:17 AM    BUN 5 10/25/2021 08:17 AM    CREATININE 0.59 10/25/2021 08:17 AM    GFRAA >60 08/05/2020 04:20 AM    AGRATIO 0.9 08/05/2020 04:20 AM    LABGLOM >60 10/25/2021 08:17 AM    GLUCOSE 93 10/25/2021 08:17 AM    PROT 7.6 10/25/2021 08:17 AM    CALCIUM 9.2 10/25/2021 08:17 AM    BILITOT 0.5 10/25/2021 08:17 AM    ALKPHOS 70 10/25/2021 08:17 AM    ALKPHOS 82 08/05/2020 04:20 AM    AST 11 10/25/2021 08:17 AM    ALT 13 10/25/2021 08:17 AM       POC Tests: No results for input(s): POCGLU, POCNA, POCK, POCCL, POCBUN, POCHEMO, POCHCT in the last 72 hours.    Coags: No results found for: PROTIME, INR, APTT    HCG (If Applicable):   Lab Results   Component Value Date    PREGTESTUR Negative 01/01/2022         ABGs: No results found for: PHART, PO2ART, PCO2ART, HCO3ART, BEART, O2SATART     Type & Screen (If Applicable):  No results found for: LABABO, LABRH    Drug/Infectious Status (If Applicable):  No results found for: HIV, HEPCAB    COVID-19 Screening (If Applicable): No results found for: COVID19        Anesthesia Evaluation  Patient summary reviewed and Nursing notes reviewed no history of anesthetic complications:   Airway: Mallampati: II  TM distance: >3 FB   Neck ROM: full  Mouth opening: > = 3 FB   Dental: normal exam         Pulmonary:Negative Pulmonary ROS and normal exam  breath sounds clear to auscultation  (+) sleep apnea (h/o multilple sleep studies in the past > 5. PaTtientg states that she was told that her sleep apnea  was not severe enough to require CPAP):                             Cardiovascular:Negative CV ROS            Rhythm: regular  Rate: normal                    Neuro/Psych:   Negative Neuro/Psych ROS              GI/Hepatic/Renal: Neg GI/Hepatic/Renal ROS            Endo/Other: Negative Endo/Other ROS                    Abdominal:              PE comment: Deferred.   Vascular: negative vascular ROS.         Other Findings:           Anesthesia Plan      general     ASA 1     (Standard ASA monitors: continuous EKG, BP, HR, pulse oximeter, temperature, and capnography.)  Induction: intravenous.    MIPS: Postoperative opioids intended and Prophylactic antiemetics administered.  Anesthetic plan and risks discussed with patient (and family, if present.).      Plan discussed with CRNA.    Attending anesthesiologist reviewed and agrees with Preprocedure content                Doyce Loose., MD   01/01/2022

## 2022-01-01 NOTE — Discharge Instructions (Addendum)
Good nutrition is important when healing from an illness, injury, or surgery.  Follow any nutrition recommendations given to you during your hospital stay.   If you were given an oral nutrition supplement while in the hospital, continue to take this supplement at home.  You can take it with meals, in-between meals, and/or before bedtime. These supplements can be purchased at most local grocery stores, pharmacies, and chain super-stores.   If you have any questions about your diet or nutrition, call the hospital and ask for the dietitian.

## 2022-01-01 NOTE — Anesthesia Post-Procedure Evaluation (Signed)
Department of Anesthesiology  Postprocedure Note    Patient: Emily Ware  MRN: [id number]  Birthdate: 25-Sep-2000  Date of evaluation: 01/01/2022      Procedure Summary     Date: 01/01/22 Room / Location: [facility] 04 / [facility]    Anesthesia Start: 0916 Anesthesia Stop: 1100    Procedure: DIAGNOSTIC LAPAROSCOPY, LEFT OVARIAN CYSTECTOMY (Left: Abdomen) Diagnosis:       Left ovarian cyst      (Left ovarian cyst [N83.202])    Surgeons: Ellen Henri., MD Responsible Provider: Doyce Loose., MD    Anesthesia Type: General ASA Status: 1          Anesthesia Type: General    Aldrete Phase I: Aldrete Score: 10    Aldrete Phase II: Aldrete Score: 10      Anesthesia Post Evaluation    Patient location during evaluation: PACU  Patient participation: complete - patient cannot participate  Level of consciousness: awake  Pain score: 0  Airway patency: patent  Nausea & Vomiting: no nausea and no vomiting  Complications: no  Cardiovascular status: hemodynamically stable  Respiratory status: acceptable  Hydration status: stable  Multimodal analgesia pain management approach

## 2022-01-01 NOTE — Op Note (Signed)
[facility]  OPERATIVE REPORT    Name:  Emily Ware, Emily Ware  MR#:  [id number]  DOB:  Apr 29, 2001  ACCOUNT #:  [id number]  DATE OF SERVICE:  01/01/2022    PREOPERATIVE DIAGNOSIS:  Large left ovarian cyst.    POSTOPERATIVE DIAGNOSIS:  Large left ovarian cyst, status post left ovarian cystectomy.    SURGEON:  Gaylord Shih, MD    SURGICAL ASSISTANT:  Jalene Mullet.    ANESTHESIA:  General.    FLUIDS:  Crystalloid.    ESTIMATED BLOOD LOSS:  20 mL.    COMPLICATIONS:  None.    IMPLANTS:  None.    DRAINS:  None.    PROCEDURE:  The patient was taken to the operating room where she was placed under general anesthesia.  She was draped and prepped in the usual sterile fashion.  Straight catheter was utilized to drain approximately 100 mL of clear yellow urine from the urinary bladder.  A split speculum was placed in the vagina.  Cervix was visualized.  The anterior cervical lip was grasped utilizing a single-tooth tenaculum.  A Hulka tenaculum was then passed through the cervical os into the body of the uterus, and the anterior cervical lip was grasped.  The remainder of the vaginal instruments were then removed.  A knife was utilized to enter a transverse incision on the lower portion of the umbilicus.  This was carried down to the level of the fascia.  The fascia was grasped utilizing Kocher clamps and elevated and incised utilizing curved Mayo scissors.    Having entered the fascia, it was noted the peritoneal cavity had been entered.  The Hasson trocar was passed through the umbilical incision site.  CO2 gas was insufflated resulting in the creation of a pneumoperitoneum.  Thd diagnostic laparoscope was passed through the umbilical trocar.  Contents of the pelvis were observed.  Additional 5-mm trocar sites were entered in the right and left lower quadrant under direct visualization.  The left ovarian cyst was brought away from the uterus superiorly.  An aspiration needle was then passed through the  right lower quadrant trocar and passed into the ovarian cyst.  Approximately 60 mL of cyst fluid was aspirated, and one sample was sent for cytology.    The cyst wall was grasped and nicked utilizing laparoscopic Jen Mow.  This incision was undermined separating the overlying ovarian tissue from the cyst wall.  The incision was extended bilaterally, and subsequently, the overlying tissue was pulled free off the underlying ovarian cyst.  This was continued until a large portion of the cyst wall had been exposed.  The cyst wall was then grasped and gradually teased free from the overlying ovarian tissue until the cyst had been completely excised from the ovary.  Having freed the cyst, it was brought up through the right lower quadrant incision site.    The pelvis was then suction irrigated.  The ovary was examined and noted to have some areas of poor hemostasis.  The LigaSure device was utilized to cauterize any areas of bleeding.  Again, the pelvis was suction irrigated.  There was noted to be good hemostasis.  Arista was then placed over the open ovarian wound.  At this time, the procedure was concluded.  All instruments were removed.  The pneumoperitoneum was evacuated.  The fascia of the umbilical trocar site was closed utilizing a running locking suture of 2-0 Vicryl.  The skin of all three incision sites were closed utilizing a subcuticular stitch of  4-0 Monocryl.  The patient tolerated the procedure well without complications.  She was taken to the recovery room in good condition.        Gaylord Shih, MD      [id number]  D:  01/01/2022 11:12  T:  01/01/2022 22:16  JOB #:  [id number]

## 2022-01-01 NOTE — Brief Op Note (Signed)
Brief Postoperative Note      Patient: Emily Ware  Date of Birth: [birth year]  MRN: [id number]    Date of Procedure: 20-Jan-2022    Pre-Op Diagnosis Codes:     * Left ovarian cyst [N83.202]    Post-Op Diagnosis: Left ovarian cyst status post left ovarian cystectomy       Procedure(s):  DIAGNOSTIC LAPAROSCOPY, LEFT OVARIAN CYSTECTOMY    Surgeon(s):  Ellen Henri., MD    Assistant:  Surgical Assistant: Jalene Mullet    Anesthesia: General    Estimated Blood Loss (mL): 20 mL    Complications: None    Specimens:   ID Type Source Tests Collected by Time Destination   1 : CYST FLUID Body Fluid Ovary CULTURE, BODY FLUID, CULTURE WITH SMEAR, ACID FAST BACILLIUS Ellen Henri., MD Jan 20, 2022 [id number]    A : OVARIAN CYST Tissue Ovary SURGICAL PATHOLOGY Ellen Henri., MD 01/20/22 1004        Implants:  None      Drains: None    Findings: Large left ovarian cyst      Electronically signed by Gavin Potters, MD on 20-Jan-2022 at 10:52 AM

## 2022-01-01 NOTE — Progress Notes (Signed)
Pt states okay to go over discharge instructions with Emily Ware

## 2022-01-02 ENCOUNTER — Emergency Department: Admit: 2022-01-02 | Payer: MEDICAID

## 2022-01-02 ENCOUNTER — Observation Stay
Admit: 2022-01-02 | Discharge: 2022-01-03 | Disposition: A | Discharge status: Home / Self Care | Payer: MEDICAID | Admitting: Specialist

## 2022-01-02 DIAGNOSIS — S301XXA Contusion of abdominal wall, initial encounter: Secondary | ICD-10-CM

## 2022-01-02 LAB — COMPREHENSIVE METABOLIC PANEL
ALT: 10 U/L — ABNORMAL LOW (ref 12–78)
AST: 9 U/L — ABNORMAL LOW (ref 15–37)
Albumin/Globulin Ratio: 1.1 (ref 1.1–2.2)
Albumin: 4 g/dL (ref 3.5–5.0)
Alk Phosphatase: 75 U/L (ref 45–117)
Anion Gap: 6 mmol/L (ref 5–15)
BUN: 10 mg/dL (ref 6–20)
Bun/Cre Ratio: 15 (ref 12–20)
CO2: 26 mmol/L (ref 21–32)
Calcium: 9.4 mg/dL (ref 8.5–10.1)
Chloride: 105 mmol/L (ref 97–108)
Creatinine: 0.68 mg/dL (ref 0.55–1.02)
Est, Glom Filt Rate: >60 mL/min/{1.73_m2} (ref >60)
Globulin: 3.5 g/dL (ref 2.0–4.0)
Glucose: 92 mg/dL (ref 65–100)
Potassium: 3.7 mmol/L (ref 3.5–5.1)
Sodium: 137 mmol/L (ref 136–145)
Total Bilirubin: 0.4 mg/dL (ref 0.2–1.0)
Total Protein: 7.5 g/dL (ref 6.4–8.2)

## 2022-01-02 LAB — URINALYSIS WITH REFLEX TO CULTURE
BACTERIA, URINE: NEGATIVE /hpf
Bilirubin Urine: NEGATIVE
Glucose, UA: NEGATIVE mg/dL
Ketones, Urine: NEGATIVE mg/dL
Nitrite, Urine: NEGATIVE
Protein, UA: NEGATIVE mg/dL
Specific Gravity, UA: 1.019 (ref 1.003–1.030)
Urobilinogen, Urine: 0.1 EU/dL (ref 0.1–1.0)
pH, Urine: 5 (ref 5.0–8.0)

## 2022-01-02 LAB — CBC WITH AUTO DIFFERENTIAL
Absolute Immature Granulocyte: 0 10*3/uL (ref 0.00–0.04)
Basophils %: 0 % (ref 0–1)
Basophils Absolute: 0 10*3/uL (ref 0.0–0.1)
Eosinophils %: 0 % (ref 0–7)
Eosinophils Absolute: 0 10*3/uL (ref 0.0–0.4)
Hematocrit: 29.7 % — ABNORMAL LOW (ref 35.0–47.0)
Hemoglobin: 9.6 g/dL — ABNORMAL LOW (ref 11.5–16.0)
Immature Granulocytes: 0 % (ref 0–0.5)
Lymphocytes %: 15 % (ref 12–49)
Lymphocytes Absolute: 1.8 10*3/uL (ref 0.8–3.5)
MCH: 29.6 PG (ref 26.0–34.0)
MCHC: 32.3 g/dL (ref 30.0–36.5)
MCV: 91.7 FL (ref 80.0–99.0)
MPV: 10.4 FL (ref 8.9–12.9)
Monocytes %: 6 % (ref 5–13)
Monocytes Absolute: 0.7 10*3/uL (ref 0.0–1.0)
Neutrophils %: 79 % — ABNORMAL HIGH (ref 32–75)
Neutrophils Absolute: 9.5 10*3/uL — ABNORMAL HIGH (ref 1.8–8.0)
Nucleated RBCs: 0 PER 100 WBC
Platelets: 313 10*3/uL (ref 150–400)
RBC: 3.24 M/uL — ABNORMAL LOW (ref 3.80–5.20)
RDW: 12.7 % (ref 11.5–14.5)
WBC: 12 10*3/uL — ABNORMAL HIGH (ref 3.6–11.0)
nRBC: 0 10*3/uL (ref 0.00–0.01)

## 2022-01-02 MED ORDER — MORPHINE SULFATE (PF) 4 MG/ML IV SOLN
4 MG/ML | INTRAVENOUS | Status: AC
Start: 2022-01-02 — End: 2022-01-02
  Administered 2022-01-02: 17:00:00 4 mg via INTRAVENOUS

## 2022-01-02 MED ORDER — ONDANSETRON HCL 4 MG/2ML IJ SOLN
4 MG/2ML | Freq: Once | INTRAMUSCULAR | Status: AC
Start: 2022-01-02 — End: 2022-01-02
  Administered 2022-01-02: 13:00:00 4 mg via INTRAVENOUS

## 2022-01-02 MED ORDER — LACTATED RINGERS IV SOLN
INTRAVENOUS | Status: DC
Start: 2022-01-02 — End: 2022-01-03
  Administered 2022-01-02: 23:00:00 via INTRAVENOUS

## 2022-01-02 MED ORDER — OXYCODONE-ACETAMINOPHEN 5-325 MG PO TABS
5-325 | ORAL | Status: DC | PRN
Start: 2022-01-02 — End: 2022-01-03
  Administered 2022-01-03 (×2): 1 via ORAL

## 2022-01-02 MED ORDER — MORPHINE SULFATE (PF) 2 MG/ML IV SOLN
2 | INTRAVENOUS | Status: DC | PRN
Start: 2022-01-02 — End: 2022-01-03
  Administered 2022-01-03 (×2): 2 mg via INTRAVENOUS

## 2022-01-02 MED ORDER — IOPAMIDOL 76 % IV SOLN
76 % | Freq: Once | INTRAVENOUS | Status: AC | PRN
Start: 2022-01-02 — End: 2022-01-02
  Administered 2022-01-02: 15:00:00 100 mL via INTRAVENOUS

## 2022-01-02 MED ORDER — OXYCODONE-ACETAMINOPHEN 5-325 MG PO TABS
5-325 MG | ORAL | Status: AC | PRN
Start: 2022-01-02 — End: 2022-01-03
  Administered 2022-01-02 – 2022-01-03 (×2): 2 via ORAL

## 2022-01-02 MED ORDER — MORPHINE SULFATE (PF) 4 MG/ML IV SOLN
4 MG/ML | INTRAVENOUS | Status: AC
Start: 2022-01-02 — End: 2022-01-02
  Administered 2022-01-02: 13:00:00 4 mg via INTRAVENOUS

## 2022-01-02 MED ORDER — SODIUM CHLORIDE 0.9 % IV BOLUS
0.9 % | INTRAVENOUS | Status: AC
Start: 2022-01-02 — End: 2022-01-02
  Administered 2022-01-02: 13:00:00 1000 mL via INTRAVENOUS

## 2022-01-02 MED FILL — ISOVUE-370 76 % IV SOLN: 76 % | INTRAVENOUS | Qty: 100

## 2022-01-02 MED FILL — SODIUM CHLORIDE 0.9 % IV SOLN: 0.9 % | INTRAVENOUS | Qty: 1000

## 2022-01-02 MED FILL — LACTATED RINGERS IV SOLN: INTRAVENOUS | Qty: 1000

## 2022-01-02 NOTE — ED Triage Notes (Signed)
Patient 1 day post op laparoscopic for cyst removal on left ovary. Patient complains of increase swelling of left incision and severe right shoulder pain.

## 2022-01-02 NOTE — ED Notes (Signed)
Report called to [facility]     Murlean Iba, RN  01/02/22 1316

## 2022-01-02 NOTE — Plan of Care (Signed)
Problem: Respiratory - Adult  Goal: Achieves optimal ventilation and oxygenation  Outcome: Progressing     Problem: Skin/Tissue Integrity - Adult  Goal: Skin integrity remains intact  Outcome: Progressing  Goal: Incisions, wounds, or drain sites healing without S/S of infection  Outcome: Progressing     Problem: Gastrointestinal - Adult  Goal: Maintains or returns to baseline bowel function  Outcome: Progressing     Problem: Infection - Adult  Goal: Absence of infection at discharge  Outcome: Progressing  Goal: Absence of infection during hospitalization  Outcome: Progressing  Goal: Absence of fever/infection during anticipated neutropenic period  Outcome: Progressing

## 2022-01-02 NOTE — ED Provider Notes (Addendum)
[facility] EMERGENCY DEPT  EMERGENCY DEPARTMENT HISTORY AND PHYSICAL EXAM      Date: 01/02/2022  Patient Name: Emily Ware  MRN: [id number]  Birthdate: 2001/02/06  Date of evaluation: 01/02/2022  Provider: Hillery Jacks, APRN - NP   Note Started: 8:42 AM EDT 01/02/22    HISTORY OF PRESENT ILLNESS     Chief Complaint   Patient presents with    Post-op Problem       History Provided By: Patient    HPI: Emily Ware is a 21 y.o. female past medical history as listed below presents to the ER with postop problem.  Patient had surgery on her left ovary yesterday.  Patient had left ovarian cyst removal.  Surgery was performed laparoscopically.  Patient comes in with swelling at one of the incision sites and right shoulder pain.    PAST MEDICAL HISTORY   Past Medical History:  Past Medical History:   Diagnosis Date    Anemia     H/O left flank pain     pt states with menstrual cycles x 3 months    Ovarian cyst     left    Pre-eclampsia 2017    During pregnancy    Preterm delivery 2018    1 child my son currently 48 years old    Sleep apnea     pt states diagnosed at 8 yrs ago ,pt states no cpap needed       Past Surgical History:  Past Surgical History:   Procedure Laterality Date    CESAREAN SECTION  2018 & 2020    OVARIAN CYST REMOVAL Left 01/01/2022    DIAGNOSTIC LAPAROSCOPY, LEFT OVARIAN CYSTECTOMY performed by Ellen Henri., MD at [facility] MAIN OR    TONSILLECTOMY      WISDOM TOOTH EXTRACTION         Family History:  Family History   Problem Relation Age of Onset    Diabetes Mother     No Known Problems Father     Diabetes Maternal Grandmother     Diabetes Maternal Grandfather        Social History:  Social History     Tobacco Use    Smoking status: Never    Smokeless tobacco: Never   Vaping Use    Vaping Use: Every day   Substance Use Topics    Alcohol use: Never    Drug use: Never       Allergies:  No Known Allergies    PCP: None None    Current Meds:   No current facility-administered medications for this  encounter.     Current Outpatient Medications   Medication Sig Dispense Refill    ondansetron (ZOFRAN-ODT) 4 MG disintegrating tablet Take 1 tablet by mouth every 8 hours as needed for Nausea or Vomiting 30 tablet 0       Social Determinants of Health:   Social Determinants of Health     Tobacco Use: Low Risk     Smoking Tobacco Use: Never    Smokeless Tobacco Use: Never    Passive Exposure: Not on file   Alcohol Use: Not At Risk    Frequency of Alcohol Consumption: Never    Average Number of Drinks: Patient does not drink    Frequency of Binge Drinking: Never   Physicist, medical Strain: Not on file   Food Insecurity: Not on file   Transportation Needs: Not on file   Physical Activity: Not on file   Stress: Not on  file   Social Connections: Not on file   Intimate Partner Violence: Not on file   Depression: Not on file   Housing Stability: Not on file       PHYSICAL EXAM   Physical Exam  Constitutional:       General: She is not in acute distress.     Appearance: She is well-developed and normal weight. She is not ill-appearing or toxic-appearing.   HENT:      Head: Normocephalic and atraumatic.      Right Ear: Tympanic membrane and ear canal normal.      Left Ear: Tympanic membrane and ear canal normal.      Mouth/Throat:      Mouth: Mucous membranes are moist.      Pharynx: Uvula midline.   Eyes:      Conjunctiva/sclera: Conjunctivae normal.      Pupils: Pupils are equal, round, and reactive to light.   Cardiovascular:      Rate and Rhythm: Normal rate and regular rhythm.      Heart sounds: Normal heart sounds.   Pulmonary:      Effort: Pulmonary effort is normal.   Abdominal:      General: There is distension.      Palpations: Abdomen is soft.      Tenderness: There is abdominal tenderness.   Musculoskeletal:         General: Tenderness present.      Cervical back: Normal range of motion.   Skin:     General: Skin is warm and dry.   Neurological:      General: No focal deficit present.      Mental Status: She is  alert and oriented to person, place, and time.   Psychiatric:         Mood and Affect: Mood normal.         SCREENINGS              LAB, EKG AND DIAGNOSTIC RESULTS   Labs:  No results found for this or any previous visit (from the past 12 hour(s)).      EKG: Initial EKG interpreted by me.Not Applicable    Radiologic Studies:  Non-plain film images such as CT, Ultrasound and MRI are read by the radiologist. Plain radiographic images are visualized and preliminarily interpreted by the ED Physician with the following findings: Not Applicable.    Interpretation per the Radiologist below, if available at the time of this note:  CT ABDOMEN PELVIS W IV CONTRAST Additional Contrast? None   Final Result   1. Intraperitoneal pelvic hematoma anterior to the uterus with a subtle density   centrally that may represent active hemorrhage. There is also a small amount of   hemoperitoneum and intraperitoneal simple free fluid along with scattered free   air that is likely postsurgical.      2. Subcutaneous soft tissue hematoma/stroma in the left anterior pelvic wall.      XR CHEST (2 VW)   Final Result      No acute cardiopulmonary process. Small pneumoperitoneum, an expected   postoperative finding; this finding could be responsible for the patient's right   shoulder pain.              ED COURSE and DIFFERENTIAL DIAGNOSIS/MDM   CC/HPI Summary, DDx, ED Course, and Reassessment: Patient presents with a postop problem.  Differential diagnosis included internal bleeding, seroma, infection, gas pain.  Patient will get a CT abdomen and pelvis to  rule out seroma.  Patient will get a chest x-ray to rule out gas pain from the laparoscopic surgery.    Records Reviewed (source and summary of external notes): Prior medical records and Nursing notes    Vitals:    Vitals:    01/03/22 1309 01/03/22 1405 01/03/22 1445 01/03/22 1755   BP:   126/85    Pulse:   87    Resp: 18 16 18 18    Temp:   98.6 F (37 C)    TempSrc:   Oral    SpO2:   98%     Weight:       Height:            ED COURSE  ED Course as of 01/22/22 1647   Tue Jan 02, 2022   0940 Leukocytosis likely reactionary from surgery yesterday. [CB]   1106 Spoke with Dr. Jan 04, 2022 who is on-call for patient's OB/GYN.  He will come down and evaluate patient at bedside for himself. [CB]      ED Course User Index  [CB] Morene Rankins, APRN - NP       Disposition Considerations (Tests not done, Shared Decision Making, Pt Expectation of Test or Treatment.): Not Applicable    Patient was given the following medications:  Medications   oxyCODONE-acetaminophen (PERCOCET) 5-325 MG per tablet 2 tablet (2 tablets Oral Given 01/02/22 2309)   0.9 % sodium chloride bolus (0 mLs IntraVENous Stopped 01/02/22 1058)   morphine (PF) injection 4 mg (4 mg IntraVENous Given 01/02/22 0912)   ondansetron (ZOFRAN) injection 4 mg (4 mg IntraVENous Given 01/02/22 0912)   iopamidol (ISOVUE-370) 76 % injection 100 mL (100 mLs IntraVENous Given 01/02/22 1039)   morphine (PF) injection 4 mg (4 mg IntraVENous Given 01/02/22 1248)   acetaminophen (TYLENOL) tablet 650 mg (650 mg Oral Given 01/03/22 1127)       CONSULTS: (Who and What was discussed)  IP CONSULT TO INTERVENTIONAL RADIOLOGY     Social Determinants affecting Dx or Tx: None    Smoking Cessation: Not Applicable    PROCEDURES   Unless otherwise noted above, none.  Procedures      CRITICAL CARE TIME   Patient does not meet Critical Care Time, 0 minutes    FINAL IMPRESSION     1. Abdominal wall hematoma, initial encounter    2. Abdominal hematoma    3. Post-op pain          DISPOSITION/PLAN   DISPOSITION Decision To Admit 01/22/2022 04:46:24 PM    Admit Note: Pt is being admitted by Dr 03/24/2022. The results of their tests and reason(s) for their admission have been discussed with pt and/or available family. They convey agreement and understanding for the need to be admitted and for the admission diagnosis.     PATIENT REFERRED TO:  No follow-up provider specified.      DISCHARGE  MEDICATIONS:     Medication List        START taking these medications      ondansetron 4 MG disintegrating tablet  Commonly known as: ZOFRAN-ODT  Take 1 tablet by mouth every 8 hours as needed for Nausea or Vomiting            STOP taking these medications      ibuprofen 800 MG tablet  Commonly known as: ADVIL;MOTRIN     oxyCODONE 5 MG immediate release tablet  Commonly known as: ROXICODONE  ASK your doctor about these medications      oxyCODONE-acetaminophen 5-325 MG per tablet  Commonly known as: PERCOCET  Take 1 tablet by mouth every 6 hours as needed for Pain for up to 7 days. Max Daily Amount: 4 tablets  Ask about: Should I take this medication?               Where to Get Your Medications        These medications were sent to [id number] ([place]), [place] - [address] - P [phone] Carmon Ginsberg [phone]  [address], [place] [address]      Phone: [phone]   ondansetron 4 MG disintegrating tablet  oxyCODONE-acetaminophen 5-325 MG per tablet           DISCONTINUED MEDICATIONS:  Discharge Medication List as of 01/03/2022  6:18 PM        STOP taking these medications       oxyCODONE (ROXICODONE) 5 MG immediate release tablet Comments:   Reason for Stopping:         ibuprofen (ADVIL;MOTRIN) 800 MG tablet Comments:   Reason for Stopping:               I am the Primary Clinician of Record: Hillery Jacks, APRN - NP (electronically signed)    (Please note that parts of this dictation were completed with voice recognition software. Quite often unanticipated grammatical, syntax, homophones, and other interpretive errors are inadvertently transcribed by the computer software. Please disregards these errors. Please excuse any errors that have escaped final proofreading.)     Hillery Jacks, APRN - NP  01/02/22 1241       Hillery Jacks, APRN - NP  01/22/22 [phone]

## 2022-01-02 NOTE — H&P (Signed)
Department of Gynecology  Attending History and Physical        CHIEF COMPLAINT:   PELVIC PAIN, swelling of the incision site    Reason for Admission:  POSTOP PAIN    History obtained from patient    HISTORY OF PRESENT ILLNESS:                     The patient is a 21 y.o. female POD#1 S/P DIAGNOSTIC LAPAROSCOPY, LEFT OVARIAN CYSTECTOMY presents with pelvic pain and swelling of the incision site.      Past Medical History:    none  Past Surgical History:    DIAGNOSTIC LAPAROSCOPY, LEFT OVARIAN CYSTECTOMY    Past Gynecological History:  none    OB History   Gravida Para Term Preterm AB Living   2 2   2   2    SAB IAB Ectopic Molar Multiple Live Births             2      # Outcome Date GA Lbr Len/2nd Weight Sex Delivery Anes PTL Lv   2 Preterm 12/01/16 [redacted]w[redacted]d  2.041 kg (4 lb 8 oz) M CS-LTranv Gen Y LIV   1 Preterm     M CS-Unspec   LIV       Medications Prior to Admission:   See RN notes    Allergies:  Patient has no known allergies.     Social History:  none    Family History:   none    REVIEW OF SYSTEMS:      Non contributory    Behavior/Psych:  negative    PHYSICAL EXAM:    Vitals:  BP (!) 133/90   Pulse 93   Temp 97.9 F (36.6 C) (Oral)   Resp 16   Ht 1.549 m (5\' 1" )   Wt 48.1 kg (106 lb)   LMP 12/18/2021 (Approximate)   SpO2 100%   BMI 20.03 kg/m     Physical Exam  Constitutional:       General: She is not in acute distress.     Appearance: She is well-developed and normal weight. She is not ill-appearing or toxic-appearing.   Rate and Rhythm: Normal rate and regular rhythm.      Heart sounds: Normal heart sounds.   Pulmonary:      Effort: Pulmonary effort is normal.   Abdominal:      General: There is distension.      Palpations: Abdomen is soft.      Tenderness: There is abdominal tenderness.   l    External Genitalia: General appearance; normal, Hair distribution; normal, Lesions absent  CONSTITUTIONAL:  awake, alert, cooperative, no apparent distress, and appears stated age    INDICATION: Abdominal  pain. Left ovarian cystectomy yesterday     COMPARISON: None.     TECHNIQUE: Helical CT of the abdomen  and pelvis  following the uneventful  intravenous administration of nonionic contrast.  Coronal and sagittal reformats  are performed. CT dose reduction was achieved through use of a standardized  protocol tailored for this examination and automatic exposure control for dose  modulation.     FINDINGS:   The visualized lung bases demonstrate no mass or consolidation. The heart size  is normal. There is no pericardial or pleural effusion.     The liver, spleen, pancreas, and adrenal glands are normal. The gall bladder is  present  without intra- or extra-hepatic biliary dilatation.       The  kidneys are symmetric without hydronephrosis. There is a subcentimeter right  kidney cyst for which no imaging follow-up is recommended.     There are no dilated bowel loops.  The appendix is normal.       There are no enlarged lymph nodes. The aorta tapers without aneurysm.     There is a hyperdense collection anterior to the uterus measuring 11.0 x 3.7 x  5.1 cm in keeping with a hematoma. There is a small amount of hemoperitoneum and  intraperitoneal simple free fluid. A subtle density centrally may represent  active hemorrhage (series 202, image 28). Scattered intraperitoneal free air is  likely postsurgical.     There is an ill-defined intermediate density collection in the left anterior  pelvic subcutaneous soft tissues measuring 5.7 x 2.5 x 5.0 cm in keeping with a  postoperative hematoma/seroma.     The urinary bladder is normal.       The bony structures are age-appropriate.     IMPRESSION:  1. Intraperitoneal pelvic hematoma anterior to the uterus with a subtle density  centrally that may represent active hemorrhage. There is also a small amount of  hemoperitoneum and intraperitoneal simple free fluid along with scattered free  air that is likely postsurgical.     2. Subcutaneous soft tissue hematoma/stroma in the left  anterior pelvic wall.      ASSESSMENT AND PLAN:      21 y.o. female POD#1 S/P DIAGNOSTIC LAPAROSCOPY, LEFT OVARIAN CYSTECTOMY presents with pelvic pain and swelling of the incision site  CT scan findings:  1. Intraperitoneal pelvic hematoma anterior to the uterus with a subtle density  centrally that may represent active hemorrhage. There is also a small amount of  hemoperitoneum and intraperitoneal simple free fluid along with scattered free  air that is likely postsurgical.    Admit for observation  CBC in am  Cold compression incision site  Repeat CT scan in 48h or ea;ier if pt CBC drops

## 2022-01-03 LAB — CBC WITH AUTO DIFFERENTIAL
Absolute Immature Granulocyte: 0 10*3/uL (ref 0.00–0.04)
Basophils %: 0 % (ref 0–1)
Basophils Absolute: 0 10*3/uL (ref 0.0–0.1)
Eosinophils %: 1 % (ref 0–7)
Eosinophils Absolute: 0.1 10*3/uL (ref 0.0–0.4)
Hematocrit: 25.1 % — ABNORMAL LOW (ref 35.0–47.0)
Hemoglobin: 7.9 g/dL — ABNORMAL LOW (ref 11.5–16.0)
Immature Granulocytes: 0 % (ref 0–0.5)
Lymphocytes %: 44 % (ref 12–49)
Lymphocytes Absolute: 2.1 10*3/uL (ref 0.8–3.5)
MCH: 29.3 PG (ref 26.0–34.0)
MCHC: 31.5 g/dL (ref 30.0–36.5)
MCV: 93 FL (ref 80.0–99.0)
MPV: 11.6 FL (ref 8.9–12.9)
Monocytes %: 6 % (ref 5–13)
Monocytes Absolute: 0.3 10*3/uL (ref 0.0–1.0)
Neutrophils %: 49 % (ref 32–75)
Neutrophils Absolute: 2.3 10*3/uL (ref 1.8–8.0)
Nucleated RBCs: 0 PER 100 WBC
Platelets: 224 10*3/uL (ref 150–400)
RBC: 2.7 M/uL — ABNORMAL LOW (ref 3.80–5.20)
RDW: 13 % (ref 11.5–14.5)
WBC: 4.7 10*3/uL (ref 3.6–11.0)
nRBC: 0 10*3/uL (ref 0.00–0.01)

## 2022-01-03 LAB — HEMOGLOBIN AND HEMATOCRIT
Hematocrit: 24.4 % — ABNORMAL LOW (ref 35.0–47.0)
Hemoglobin: 8.1 g/dL — ABNORMAL LOW (ref 11.5–16.0)

## 2022-01-03 MED ORDER — OXYCODONE-ACETAMINOPHEN 5-325 MG PO TABS
5-325 MG | ORAL_TABLET | Freq: Four times a day (QID) | ORAL | 0 refills | Status: AC | PRN
Start: 2022-01-03 — End: 2022-01-10

## 2022-01-03 MED ORDER — ONDANSETRON 4 MG PO TBDP
4 MG | Freq: Three times a day (TID) | ORAL | Status: DC | PRN
Start: 2022-01-03 — End: 2022-01-03
  Administered 2022-01-03: 15:00:00 4 mg via ORAL

## 2022-01-03 MED ORDER — ACETAMINOPHEN 325 MG PO TABS
325 MG | ORAL | Status: AC | PRN
Start: 2022-01-03 — End: 2022-01-03
  Administered 2022-01-03: 15:00:00 650 mg via ORAL

## 2022-01-03 MED ORDER — ONDANSETRON 4 MG PO TBDP
4 MG | ORAL_TABLET | Freq: Three times a day (TID) | ORAL | 0 refills | Status: AC | PRN
Start: 2022-01-03 — End: ?

## 2022-01-03 NOTE — Discharge Summary (Signed)
[facility] Discharge Summary     Patient ID:  Emily Ware  [id number]  21 y.o.  2000/10/02    Admit date: 01/02/2022    Discharge date and time: 01/03/22    Admitting Physician: Randye Lobo, MD     Discharge Physician: Lora Paula, MD    Admission Diagnoses: Post-operative complication [T81.9XXA]    Hospital Course: Shaneisha Burkel had unremarkeable progressive recovery.  and Eating, ambulating, and voiding in a routine manner.    Case d/w IR MD- recommended not to drain hematoma- may do more harm than good.  Hemoglobin stable so no indication for CTA to evaluate    Significant Diagnostic Studies:   Recent Results (from the past 24 hour(s))   CBC with Auto Differential    Collection Time: 01/03/22  4:40 AM   Result Value Ref Range    WBC 4.7 3.6 - 11.0 K/uL    RBC 2.70 (L) 3.80 - 5.20 M/uL    Hemoglobin 7.9 (L) 11.5 - 16.0 g/dL    Hematocrit 62.8 (L) 35.0 - 47.0 %    MCV 93.0 80.0 - 99.0 FL    MCH 29.3 26.0 - 34.0 PG    MCHC 31.5 30.0 - 36.5 g/dL    RDW 31.5 17.6 - 16.0 %    Platelets 224 150 - 400 K/uL    MPV 11.6 8.9 - 12.9 FL    Nucleated RBCs 0.0 0.0 PER 100 WBC    nRBC 0.00 0.00 - 0.01 K/uL    Neutrophils % 49 32 - 75 %    Lymphocytes % 44 12 - 49 %    Monocytes % 6 5 - 13 %    Eosinophils % 1 0 - 7 %    Basophils % 0 0 - 1 %    Immature Granulocytes 0 0 - 0.5 %    Neutrophils Absolute 2.3 1.8 - 8.0 K/UL    Lymphocytes Absolute 2.1 0.8 - 3.5 K/UL    Monocytes Absolute 0.3 0.0 - 1.0 K/UL    Eosinophils Absolute 0.1 0.0 - 0.4 K/UL    Basophils Absolute 0.0 0.0 - 0.1 K/UL    Absolute Immature Granulocyte 0.0 0.00 - 0.04 K/UL    Differential Type AUTOMATED     Hemoglobin and Hematocrit    Collection Time: 01/03/22  5:07 PM   Result Value Ref Range    Hemoglobin 8.1 (L) 11.5 - 16.0 g/dL    Hematocrit 73.7 (L) 35.0 - 47.0 %     Recent Results (from the past 12 hour(s))   Hemoglobin and Hematocrit    Collection Time: 01/03/22  5:07 PM   Result Value Ref Range    Hemoglobin 8.1 (L) 11.5 - 16.0 g/dL    Hematocrit 10.6  (L) 35.0 - 47.0 %       Procedures: None    Discharge Exam:  BP 126/85   Pulse 87   Temp 98.6 F (37 C) (Oral)   Resp 18   Ht 5\' 1"  (1.549 m)   Wt 106 lb (48.1 kg)   LMP 12/18/2021 (Approximate)   SpO2 98%   BMI 20.03 kg/m        Patient Instructions:   Current Discharge Medication List        CONTINUE these medications which have NOT CHANGED    Details   oxyCODONE (ROXICODONE) 5 MG immediate release tablet Take 1 tablet by mouth every 6 hours as needed for Pain for up to 5 days. Max Daily Amount: 20  mg  Qty: 12 tablet, Refills: 0    Comments: Reduce doses taken as pain becomes manageable  Associated Diagnoses: Post-op pain      ibuprofen (ADVIL;MOTRIN) 800 MG tablet Take 1 tablet by mouth every 6 hours as needed for Pain  Qty: 40 tablet, Refills: 0            Activity: physical activity is restricted per discharge instructions  Diet: resume normal diet  Wound Care: keep wound clean and dry  Disposition: Home  Follow-up with MD in 1 week.    Questions addressed with pt earlier in the day and pt agrees with the plan pending f/up hgb results    Signed:  Lora Paula, MD  01/03/2022  6:09 PM

## 2022-01-03 NOTE — Progress Notes (Addendum)
1830: Discharge instructions reviewed. Rx for percocet and zofran sent to pharmacy on file. Patient aware purpose and side effects of meds. Patient aware follow up date, time and location. Aware signs and symptoms of depression to call md regarding after discharge. No questions. States understanding.      1835: Patient discharged request to ambulate off unit. Steady gait. Belongings with patient

## 2022-01-03 NOTE — Progress Notes (Signed)
Progress Note    Patient: Emily Ware MRN: [id number]  SSN: [id number]    Date of Birth: 10-Jun-2001  Age: 21 y.o.  Sex: female      Admit Date: 01/02/2022    LOS: 0 days     Subjective:     Complains of some pelvic pressure /discomfort. Denies any dizziness    Objective:     Vitals:    01/03/22 0517 01/03/22 0805 01/03/22 0814 01/03/22 0840   BP:  106/62     Pulse:  88     Resp: 16 16 16 18    Temp:  98.3 F (36.8 C)     TempSrc:  Oral     SpO2:  99%     Weight:       Height:            Intake and Output:  Current Shift: 07/19 0701 - 07/19 1900  In: -   Out: 1100 [Urine:1100]  Last three shifts: 07/17 1901 - 07/19 0700  In: 1360 [P.O.:360]  Out: 700 [Urine:700]    Physical Exam:   NAD  Abd: some mild distension    Lab/Data Review:  All lab results for the last 24 hours reviewed.    Recent Results (from the past 24 hour(s))   Urinalysis with Reflex to Culture    Collection Time: 01/02/22 10:51 AM    Specimen: Urine   Result Value Ref Range    Color, UA Yellow/Straw      Appearance Turbid (A) Clear      Specific Gravity, UA 1.019 1.003 - 1.030      pH, Urine 5.0 5.0 - 8.0      Protein, UA Negative Negative mg/dL    Glucose, UA Negative Negative mg/dL    Ketones, Urine Negative Negative mg/dL    Bilirubin Urine Negative Negative      Blood, Urine Moderate (A) Negative      Urobilinogen, Urine 0.1 0.1 - 1.0 EU/dL    Nitrite, Urine Negative Negative      Leukocyte Esterase, Urine Moderate (A) Negative      WBC, UA 5-10 0 - 4 /hpf    RBC, UA 0-5 0 - 5 /hpf    Epithelial Cells UA Few Few /lpf    BACTERIA, URINE Negative Negative /hpf    Urine Culture if Indicated Culture not indicated by UA result Culture not indicated by UA result      Mucus, UA Trace (A) Negative /lpf   CBC with Auto Differential    Collection Time: 01/03/22  4:40 AM   Result Value Ref Range    WBC 4.7 3.6 - 11.0 K/uL    RBC 2.70 (L) 3.80 - 5.20 M/uL    Hemoglobin 7.9 (L) 11.5 - 16.0 g/dL    Hematocrit 01/05/22 (L) 35.0 - 47.0 %    MCV 93.0 80.0 - 99.0 FL     MCH 29.3 26.0 - 34.0 PG    MCHC 31.5 30.0 - 36.5 g/dL    RDW 18.2 99.3 - 71.6 %    Platelets 224 150 - 400 K/uL    MPV 11.6 8.9 - 12.9 FL    Nucleated RBCs 0.0 0.0 PER 100 WBC    nRBC 0.00 0.00 - 0.01 K/uL    Neutrophils % 49 32 - 75 %    Lymphocytes % 44 12 - 49 %    Monocytes % 6 5 - 13 %    Eosinophils % 1 0 - 7 %  Basophils % 0 0 - 1 %    Immature Granulocytes 0 0 - 0.5 %    Neutrophils Absolute 2.3 1.8 - 8.0 K/UL    Lymphocytes Absolute 2.1 0.8 - 3.5 K/UL    Monocytes Absolute 0.3 0.0 - 1.0 K/UL    Eosinophils Absolute 0.1 0.0 - 0.4 K/UL    Basophils Absolute 0.0 0.0 - 0.1 K/UL    Absolute Immature Granulocyte 0.0 0.00 - 0.04 K/UL    Differential Type AUTOMATED           Assessment:     Patient Active Problem List   Diagnosis    Left ovarian cyst    Post-operative complication    Abdominal hematoma    Hematoma of abdominal wall        Plan:     Findings DWP- questions addressed  Consult IR for embolization, drainage of hematoma  Pt agrees  Case D/w Dr. Marliss Czar    Signed By: Lora Paula, MD     January 03, 2022

## 2022-01-05 LAB — CULTURE, BODY FLUID
Culture: NO GROWTH
Gram stain: NONE SEEN
Gram stain: NONE SEEN

## 2022-01-11 ENCOUNTER — Ambulatory Visit: Admit: 2022-01-11 | Discharge: 2022-01-11 | Payer: MEDICAID | Attending: Obstetrics & Gynecology

## 2022-01-11 DIAGNOSIS — Z09 Encounter for follow-up examination after completed treatment for conditions other than malignant neoplasm: Secondary | ICD-10-CM

## 2022-01-11 NOTE — Progress Notes (Signed)
Emily Ware is a 21 y.o. female who presents today for the following:  Chief Complaint   Patient presents with    Post-Op Check          HPI  Is a 21 year old G2 P2 female who presents today for postop exam following laparoscopic ovarian cystectomy.  Post exam patient developed a hematoma around the left lower quadrant trocar site.  She reports that this was quite swollen at one point but it seems to be resolving and has improved greatly since following up in the hospital.    OB History       Gravida   2    Para   2    Term        Preterm   2    AB        Living   2         SAB        IAB        Ectopic        Molar        Multiple        Live Births   2                      @VS1 @   OBGyn Exam   Patient is a well-developed well-nourished female no apparent distress  She is alert and oriented x3  All abdominal incisions appear to be healing well.  There is still significant bruising surrounding the left lower quadrant trocar site with some continued swelling in that area consistent with a resolving hematoma.  Patient states that the area was much larger previously.  @THISVISITONLYTEXTRESULTS @       Assessment:  Normal postop visit with resolving abdominal wall hematoma.    No orders of the defined types were placed in this encounter.

## 2022-02-21 LAB — CULTURE WITH SMEAR, ACID FAST BACILLIUS
ACID FAST CULTURE: NEGATIVE
AFB Smear: NEGATIVE
